# Patient Record
Sex: Male | Born: 1995 | Race: White | Hispanic: No | Marital: Single | Smoking: Never smoker
Health system: Southern US, Community
[De-identification: ages and names within clinical notes are randomized; demographics above are authoritative.]

---
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
## 2017-02-05 ENCOUNTER — Ambulatory Visit: Payer: Self-pay

## 2017-02-05 NOTE — Telephone Encounter (Signed)
   Reason for Disposition . Cold with no complications  Answer Assessment - Initial Assessment Questions 1. ONSET: "When did the nasal discharge start?"      Started 2 days ago 2. AMOUNT: "How much discharge is there?"      A lot 3. COUGH: "Do you have a cough?" If yes, ask: "Describe the color of your sputum" (clear, white, yellow, green)     Dark yellow 4. RESPIRATORY DISTRESS: "Describe your breathing."      No 5. FEVER: "Do you have a fever?" If so, ask: "What is your temperature, how was it measured, and when did it start?"     No 6. SEVERITY: "Overall, how bad are you feeling right now?" (e.g., doesn't interfere with normal activities, staying home from school/work, staying in bed)      No 7. OTHER SYMPTOMS: "Do you have any other symptoms?" (e.g., sore throat, earache, wheezing, vomiting)     Ears feel full 8. PREGNANCY: "Is there any chance you are pregnant?" "When was your last menstrual period?"     No  Protocols used: COMMON COLD-A-AH  Pt. Will try home remedies for cold symptoms as discussed. Will call back if no better 5-7 days.

## 2017-02-11 ENCOUNTER — Telehealth: Payer: Self-pay | Admitting: General Practice

## 2017-02-11 NOTE — Telephone Encounter (Signed)
Copied from CRM [id number]. Topic: Quick Communication - See Telephone Encounter >> Feb 11, 2017  8:17 AM Elliot GaultBell, Tiffany M wrote: CRM for notification. See Telephone encounter for:   02/11/17.  Caller name: Isabella BowensGARCIA,JESUS F Relation to pt: father  Call back number: [phone]   Reason for call:  Father [facility] note) and the OTC recommendation didn't work, patient experiencing coughing, congestion, please advise. (patient has new patient appointment 02/26/16 with Emi BelfastGessner, Deborah B, FNP

## 2017-02-11 NOTE — Telephone Encounter (Signed)
Patient is not better with OTC medications.  Fever 101.0 last time he took No vomiting, Patient complaints of chills, coughing, patient did not get flu vaccine this year.  Spoke with Ralph Reyes, the flow coordinator and she did indicate patient's could be seen for Acute appointments prior to establishing care. / No appointments available at [place] office.  Patients father [facility].  Patients father is seeing Dr. Patsy Reyes today for the same symptoms and wants to bring his son in too. /  Skyped Ralph Reyes:  Dad's PCP is Dr. Alphonsus Reyes but he is seeing Dr. Patsy Reyes today, asked  to see if Dr. Alphonsus Reyes would be willing to work Ralph Reyes in as well.  Per Ralph Reyes, there is no availability today.  Patient will need to go to urgent care. / Father verbalized understanding.

## 2017-02-19 ENCOUNTER — Encounter: Payer: Self-pay | Admitting: Nurse Practitioner

## 2017-02-19 ENCOUNTER — Ambulatory Visit (INDEPENDENT_AMBULATORY_CARE_PROVIDER_SITE_OTHER): Payer: Self-pay | Admitting: Nurse Practitioner

## 2017-02-19 VITALS — BP 110/70 | HR 62 | Temp 98.3°F | Wt 175.0 lb

## 2017-02-19 DIAGNOSIS — J069 Acute upper respiratory infection, unspecified: Secondary | ICD-10-CM

## 2017-02-19 NOTE — Patient Instructions (Signed)
Advised to continue OTC medications for cold symptoms. If fatigue continues recommend Mono testing with his PCP on Monday. RTC with any worsening congestion or cough as discussed or with new symptoms.

## 2017-02-19 NOTE — Progress Notes (Signed)
Subjective:     Ralph Reyes is a 22 y.o. male who presents for evaluation of congestion and fatigue. He is a [employer] student with no history of mono. He is currently home for Winter break. [facility] [place]. Symptoms include loss of appetite, congestion, fatigue, wanting to sleep all the time. Onset of symptoms was 2 weeks ago, the sinus congestion has been improving in the past few days. His fatigue and loss of appetite have continued. He has been eating and tolerating foods well. Denies nausea or diarrhea, he has had normal BMs daily.     Review of Systems Pertinent items noted in HPI and remainder of comprehensive ROS otherwise negative.   Objective:    General appearance: alert and fatigued Head: Normocephalic, without obvious abnormality, atraumatic Eyes: conjunctivae/corneas clear. PERRL, EOM's intact. Fundi benign. Ears: normal TM's and external ear canals both ears Nose: Nares normal. Septum midline. Mucosa normal. No drainage or sinus tenderness. Throat: lips, mucosa, and tongue normal; teeth and gums normal Lungs: clear to auscultation bilaterally Heart: regular rate and rhythm, S1, S2 normal, no murmur, click, rub or gallop Abdomen: soft, non-tender; bowel sounds normal; no masses,  no organomegaly Skin: Skin color, texture, turgor normal. No rashes or lesions Lymph nodes: Cervical, supraclavicular, and axillary nodes normal.   Assessment:    viral upper respiratory illness    Plan:   Advised patient to continue over the counter medications for relief of congestion. Advil as needed for headache or pain. Discussed possible mono diagnosis with patient, advised f/u with PCP on Monday for mono testing if fatigue continues without other symptoms. RTC if congestion worsens or with new symptoms as discussed. Briefly reviewed mono precautions and advised avoiding alcohol, tylenol and contact sports until diagnosis is confirmed.

## 2017-02-20 DIAGNOSIS — R5381 Other malaise: Secondary | ICD-10-CM | POA: Diagnosis not present

## 2017-02-20 DIAGNOSIS — R5383 Other fatigue: Secondary | ICD-10-CM | POA: Diagnosis not present

## 2017-02-20 DIAGNOSIS — J019 Acute sinusitis, unspecified: Secondary | ICD-10-CM | POA: Diagnosis not present

## 2017-02-25 ENCOUNTER — Ambulatory Visit: Payer: Self-pay | Admitting: Family Medicine

## 2017-07-05 DIAGNOSIS — F17200 Nicotine dependence, unspecified, uncomplicated: Secondary | ICD-10-CM | POA: Diagnosis not present

## 2017-07-05 DIAGNOSIS — H6503 Acute serous otitis media, bilateral: Secondary | ICD-10-CM | POA: Diagnosis not present

## 2017-07-05 DIAGNOSIS — Z68.41 Body mass index (BMI) pediatric, 5th percentile to less than 85th percentile for age: Secondary | ICD-10-CM | POA: Diagnosis not present

## 2018-09-07 DIAGNOSIS — H52223 Regular astigmatism, bilateral: Secondary | ICD-10-CM | POA: Diagnosis not present

## 2019-02-17 DIAGNOSIS — M546 Pain in thoracic spine: Secondary | ICD-10-CM | POA: Diagnosis not present

## 2019-02-17 DIAGNOSIS — M545 Low back pain: Secondary | ICD-10-CM | POA: Diagnosis not present

## 2019-06-03 DIAGNOSIS — Z7689 Persons encountering health services in other specified circumstances: Secondary | ICD-10-CM | POA: Diagnosis not present

## 2019-12-21 DIAGNOSIS — R519 Headache, unspecified: Secondary | ICD-10-CM | POA: Diagnosis not present

## 2020-11-05 DIAGNOSIS — H5213 Myopia, bilateral: Secondary | ICD-10-CM | POA: Diagnosis not present

## 2020-11-05 DIAGNOSIS — H52223 Regular astigmatism, bilateral: Secondary | ICD-10-CM | POA: Diagnosis not present

## 2023-12-22 DIAGNOSIS — H52223 Regular astigmatism, bilateral: Secondary | ICD-10-CM | POA: Diagnosis not present

## 2024-01-26 ENCOUNTER — Ambulatory Visit: Admitting: Family Medicine
